# Patient Record
Sex: Female | Born: 1990 | Race: White | Hispanic: No | Marital: Married
Health system: Southern US, Community
[De-identification: ages and names within clinical notes are randomized; demographics above are authoritative.]

---
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
## 2010-06-01 ENCOUNTER — Emergency Department: Payer: Self-pay | Admitting: Emergency Medicine

## 2010-10-10 ENCOUNTER — Emergency Department: Payer: Self-pay | Admitting: Emergency Medicine

## 2010-10-17 ENCOUNTER — Emergency Department: Payer: Self-pay | Admitting: Unknown Physician Specialty

## 2011-03-08 IMAGING — US US OB < 14 WEEKS - US OB TV
1 series · 17 of 28 positions shown · non-contrast
Comparison: none

REASON FOR EXAM: s/p bleeding
COMMENTS:

PROCEDURE:     US  - US OB LESS THAN 14 WEEKS/W TRANS  - June 01, 2010  [DATE]
RESULT:
HISTORY: Bleeding.

[Series 1: us ob < 14 weeks - us ob tv · 17 of 94 slices shown]
[im 1/94]
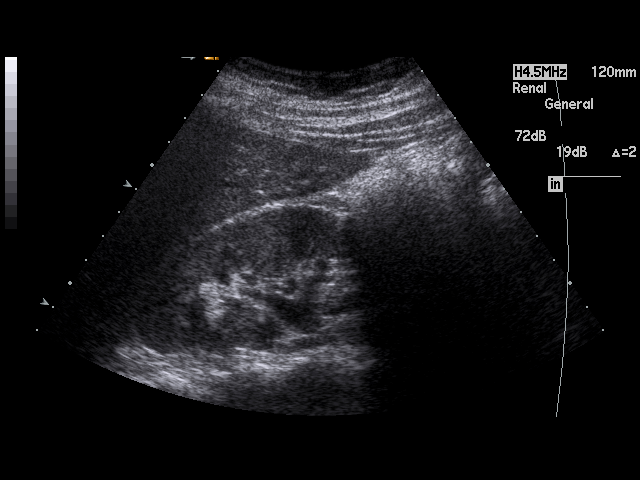
[im 7/94]
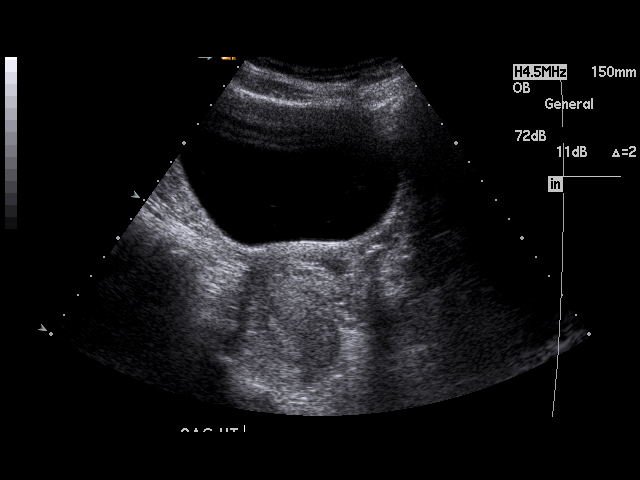
[im 14/94]
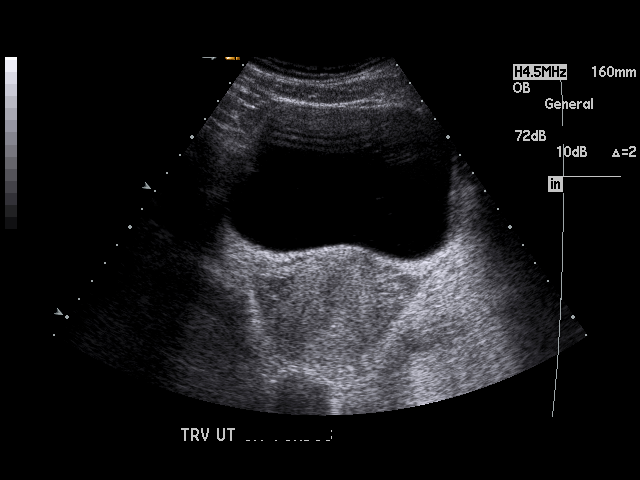
[im 18/94]
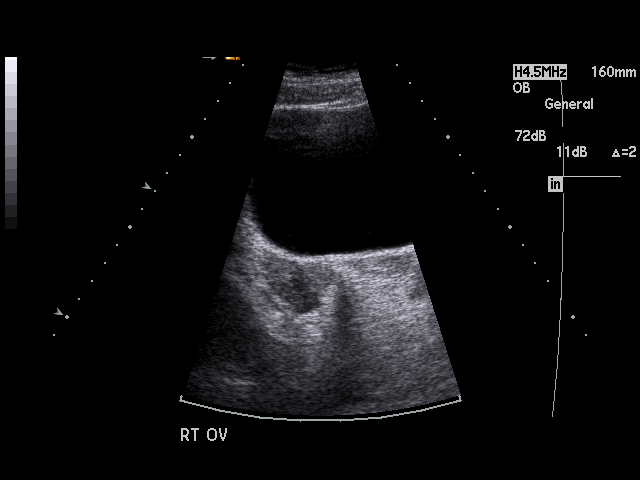
[im 25/94]
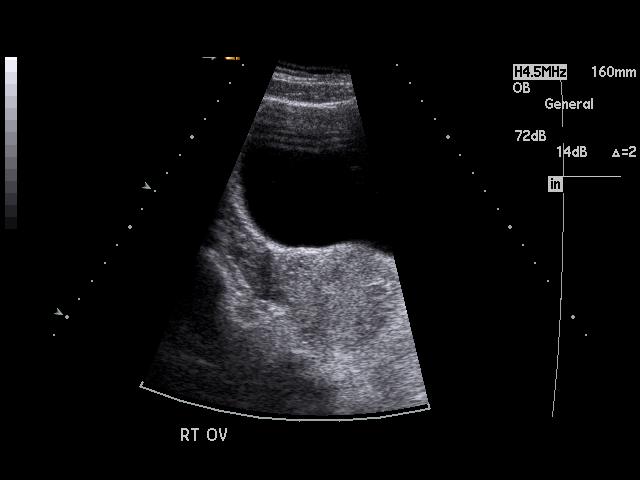
[im 32/94]
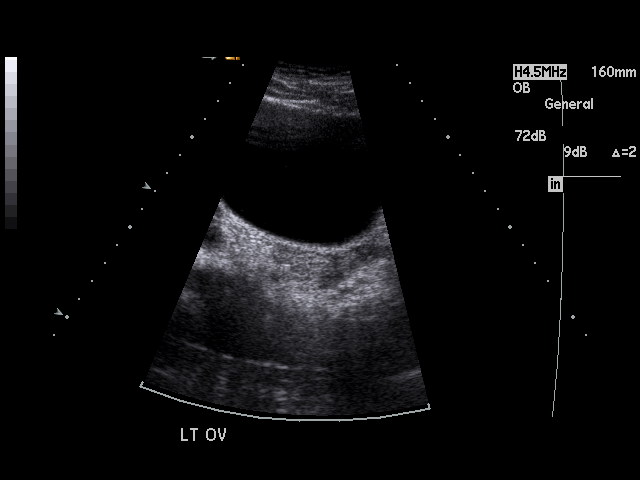
[im 35/94]
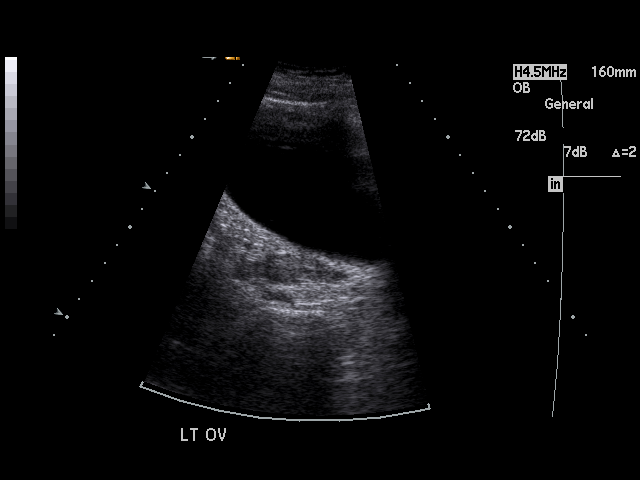
[im 42/94]
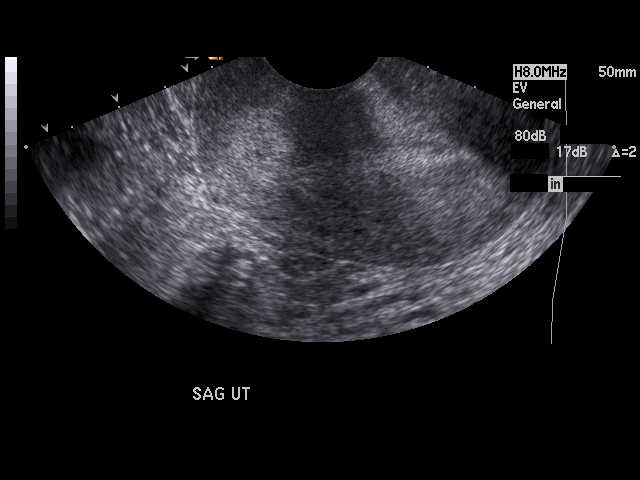
[im 49/94]
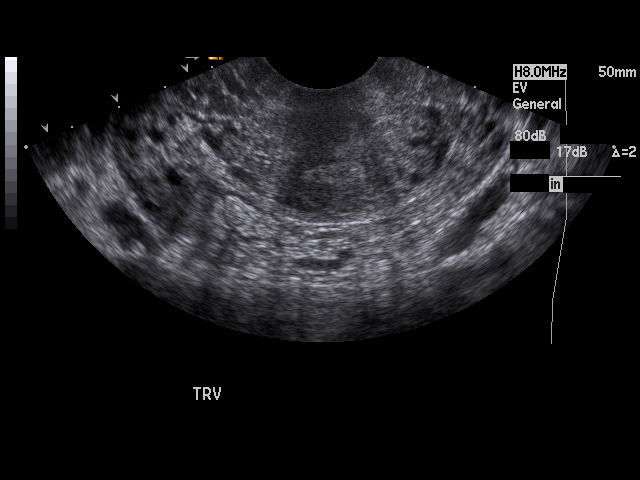
[im 52/94]
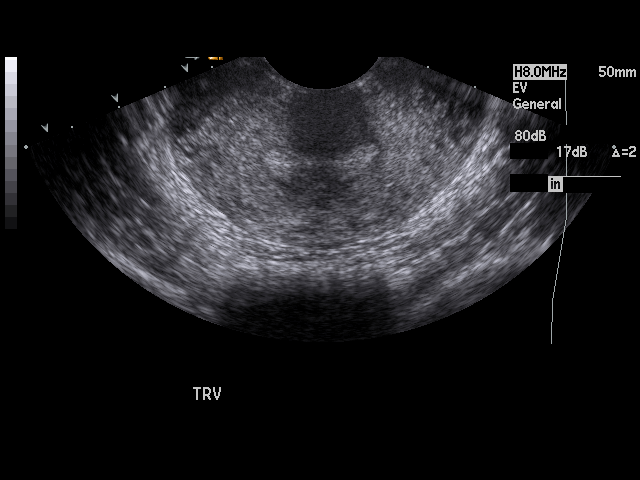
[im 59/94]
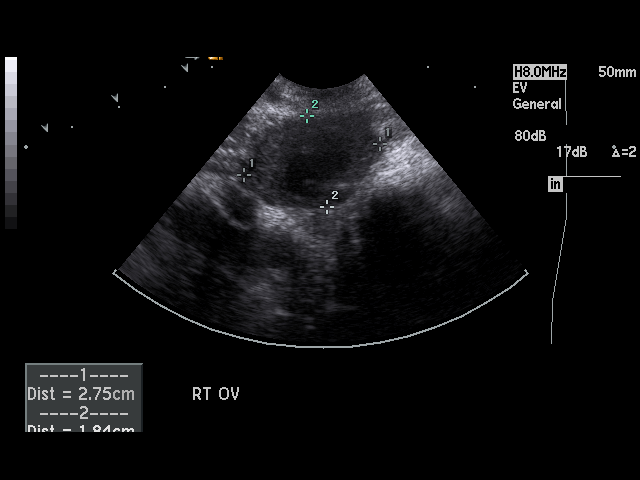
[im 63/94]
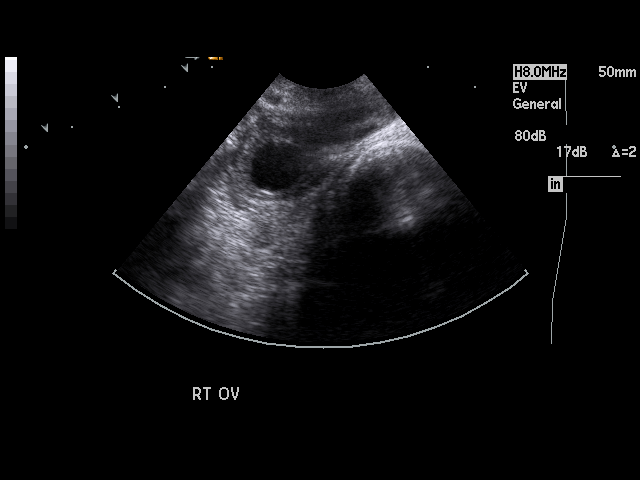
[im 69/94]
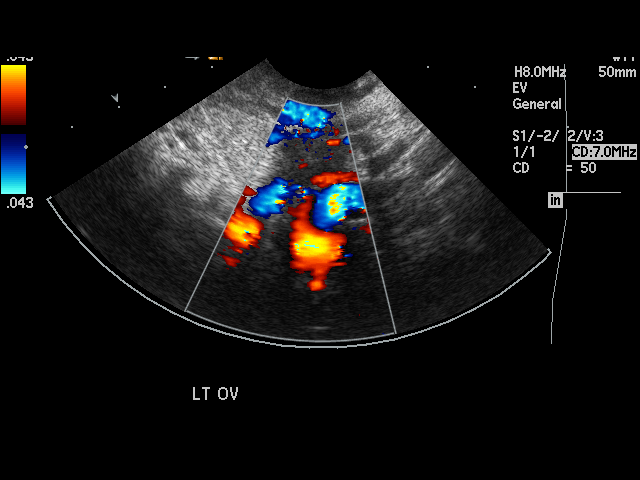
[im 76/94]
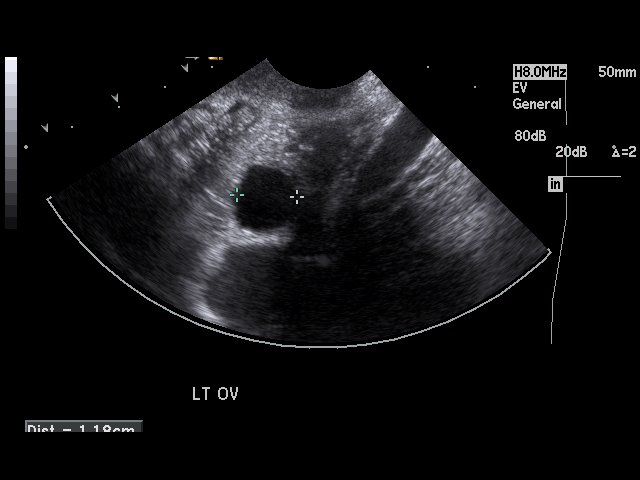
[im 80/94]
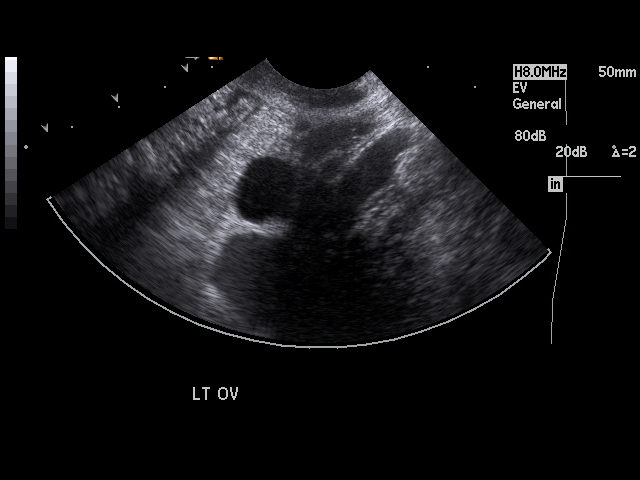
[im 87/94]
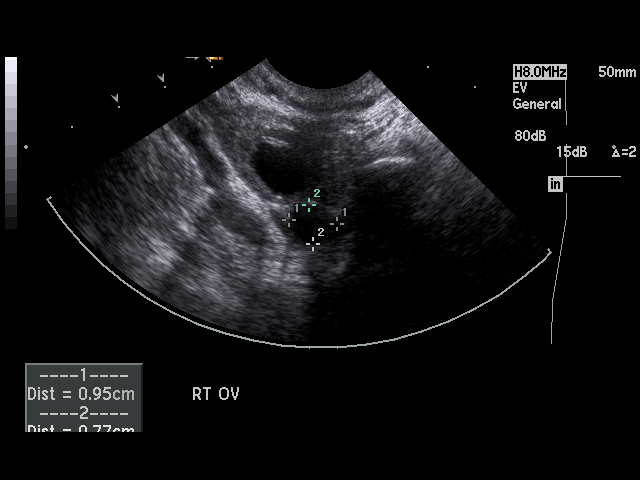
[im 94/94]
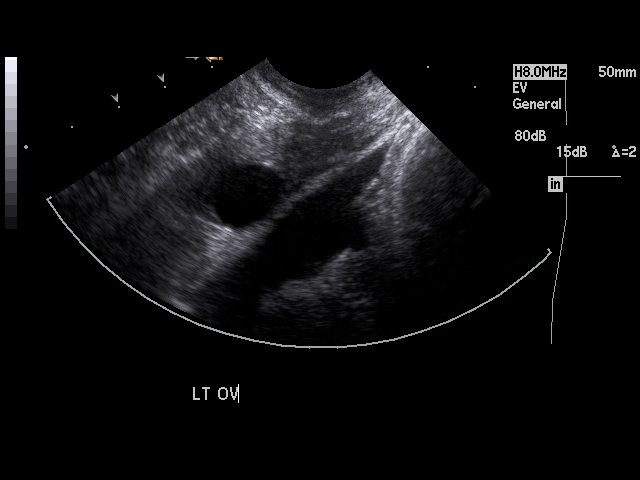

[17 of 28 positions shown; findings below may reference images not displayed]

PROCEDURE AND FINDINGS:  Standard OB ultrasound is obtained. No intrauterine
gestation is noted. The patient has a history of previously visualized
pregnancy from recent ultrasound. These findings suggest aborted pregnancy.
An embryonic pregnancy and ectopic pregnancy could also present in this
fashion. Early gestation could present in this fashion. Follow-up Beta HCG's
and Pelvic Ultrasound suggested. No adnexal masses are noted. A minimal
amount of fluid is noted in the endometrial canal. The uterus is
retroverted. Tiny ovarian cysts are present. No definite ectopic identified.
IMPRESSION: No intrauterine gestation identified. This report was
phoned to the patient's physician at the time of the study. Follow-up Beta
HCG's and Pelvic Ultrasound suggested as described above.

## 2011-11-19 ENCOUNTER — Emergency Department: Payer: Self-pay | Admitting: Internal Medicine

## 2011-11-19 LAB — HCG, QUANTITATIVE, PREGNANCY: Beta Hcg, Quant.: 9667 m[IU]/mL — ABNORMAL HIGH

## 2011-11-19 LAB — CBC
HCT: 33.2 % — ABNORMAL LOW (ref 35.0–47.0)
MCV: 93 fL (ref 80–100)
Platelet: 174 10*3/uL (ref 150–440)
RDW: 11.7 % (ref 11.5–14.5)
WBC: 6.2 10*3/uL (ref 3.6–11.0)

## 2011-11-19 LAB — COMPREHENSIVE METABOLIC PANEL
Albumin: 3.6 g/dL (ref 3.4–5.0)
Alkaline Phosphatase: 47 U/L — ABNORMAL LOW (ref 50–136)
Anion Gap: 10 (ref 7–16)
BUN: 10 mg/dL (ref 7–18)
Bilirubin,Total: 0.4 mg/dL (ref 0.2–1.0)
Co2: 25 mmol/L (ref 21–32)
Creatinine: 0.56 mg/dL — ABNORMAL LOW (ref 0.60–1.30)
Glucose: 83 mg/dL (ref 65–99)
Potassium: 3.6 mmol/L (ref 3.5–5.1)
SGOT(AST): 13 U/L — ABNORMAL LOW (ref 15–37)
Total Protein: 7.2 g/dL (ref 6.4–8.2)

## 2011-11-19 LAB — URINALYSIS, COMPLETE
Bilirubin,UR: NEGATIVE
Glucose,UR: NEGATIVE mg/dL (ref 0–75)
Leukocyte Esterase: NEGATIVE
Ph: 5 (ref 4.5–8.0)
RBC,UR: 7 /HPF (ref 0–5)
Squamous Epithelial: 4
WBC UR: 3 /HPF (ref 0–5)

## 2012-01-11 ENCOUNTER — Emergency Department: Payer: Self-pay | Admitting: Emergency Medicine

## 2012-01-11 LAB — URINALYSIS, COMPLETE
Bilirubin,UR: NEGATIVE
Blood: NEGATIVE
Ketone: NEGATIVE
Ph: 7 (ref 4.5–8.0)
Protein: NEGATIVE
Specific Gravity: 1.019 (ref 1.003–1.030)
Squamous Epithelial: 2

## 2012-01-11 LAB — PREGNANCY, URINE: Pregnancy Test, Urine: POSITIVE m[IU]/mL

## 2012-01-12 LAB — HCG, QUANTITATIVE, PREGNANCY: Beta Hcg, Quant.: 65674 m[IU]/mL — ABNORMAL HIGH

## 2012-03-03 ENCOUNTER — Observation Stay: Payer: Self-pay

## 2012-03-03 LAB — CBC WITH DIFFERENTIAL/PLATELET
Basophil %: 0.3 %
Eosinophil #: 0.1 10*3/uL (ref 0.0–0.7)
Eosinophil %: 0.9 %
HCT: 26.7 % — ABNORMAL LOW (ref 35.0–47.0)
Lymphocyte %: 23.6 %
MCV: 95 fL (ref 80–100)
Neutrophil %: 69 %
RBC: 2.81 10*6/uL — ABNORMAL LOW (ref 3.80–5.20)

## 2012-03-03 LAB — URINALYSIS, COMPLETE
Bacteria: NONE SEEN
Bilirubin,UR: NEGATIVE
Glucose,UR: NEGATIVE mg/dL (ref 0–75)
Nitrite: NEGATIVE
Protein: NEGATIVE
WBC UR: 1 /HPF (ref 0–5)

## 2012-10-18 IMAGING — US US OB US >=[ID] SNGL FETUS
1 series · 17 of 28 positions shown · non-contrast
Comparison: none

REASON FOR EXAM: 15 weeks pregnant with pelvic pain
COMMENTS:

PROCEDURE:     US  - US OB GREATER/OR EQUAL TO 8GOIA  - January 12, 2012  [DATE]
RESULT:
Transabdominal imaging of the abdomen was obtained.
TECHNIQUE: Real-time transabdominal B-mode OB ultrasound evaluation with
image documentation was obtained.

[Series 1: us ob us >=(id) sngl fetus · 17 of 79 slices shown]
[im 1/79]
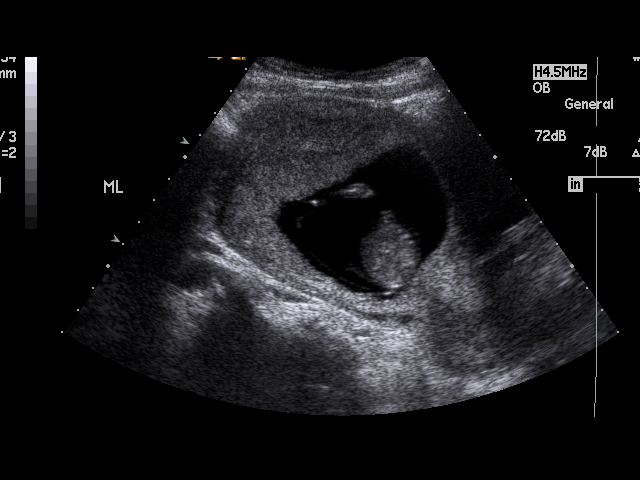
[im 6/79]
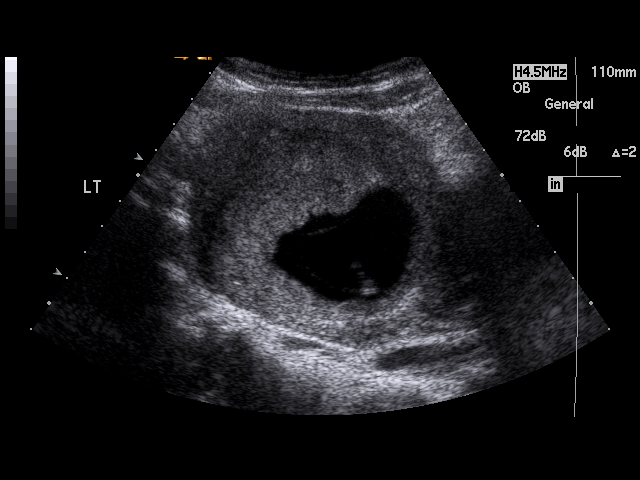
[im 12/79]
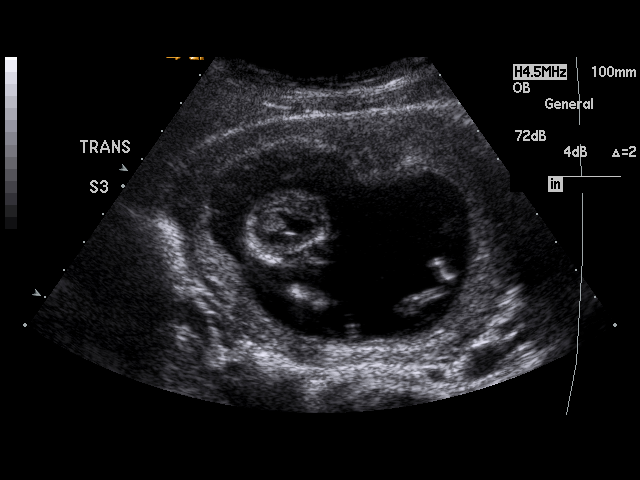
[im 15/79]
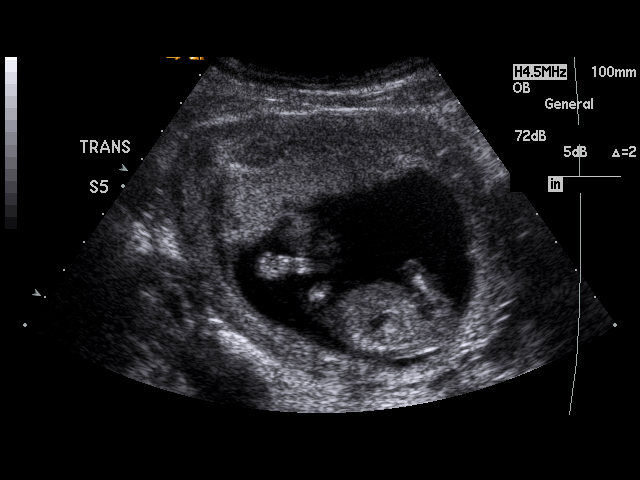
[im 21/79]
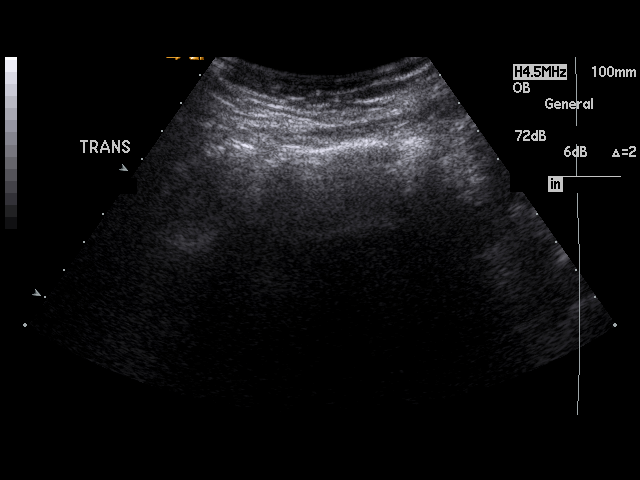
[im 27/79]
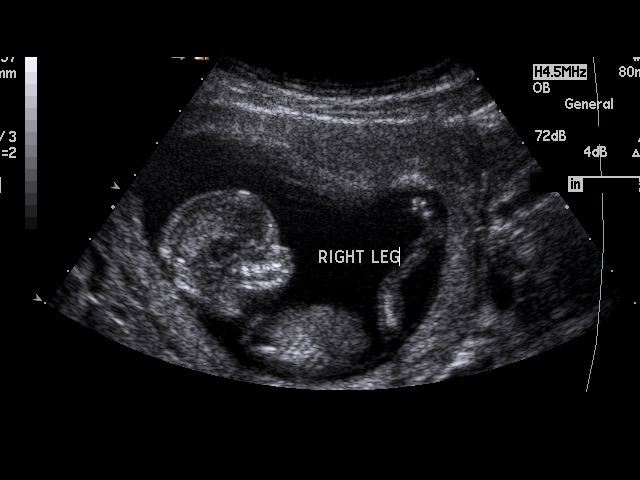
[im 29/79]
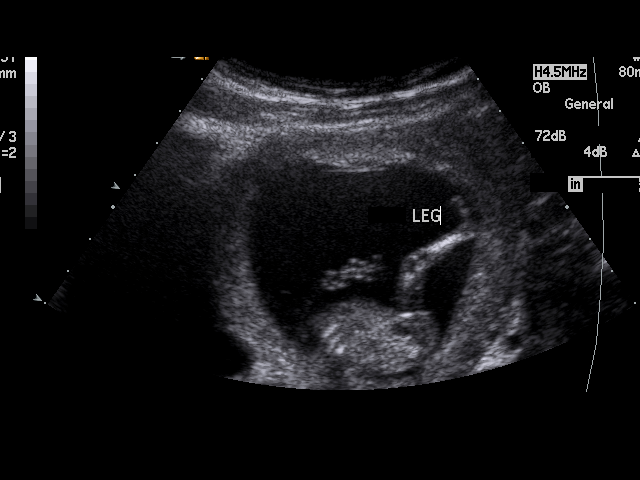
[im 35/79]
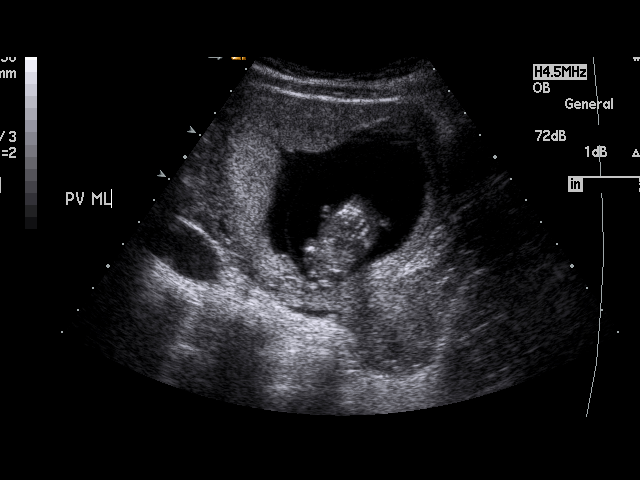
[im 41/79]
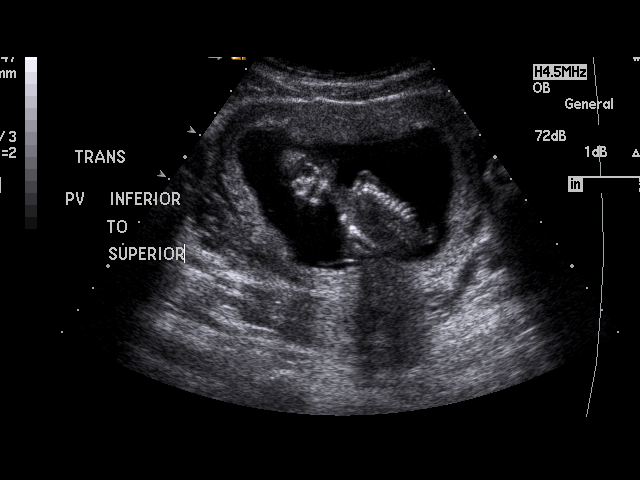
[im 44/79]
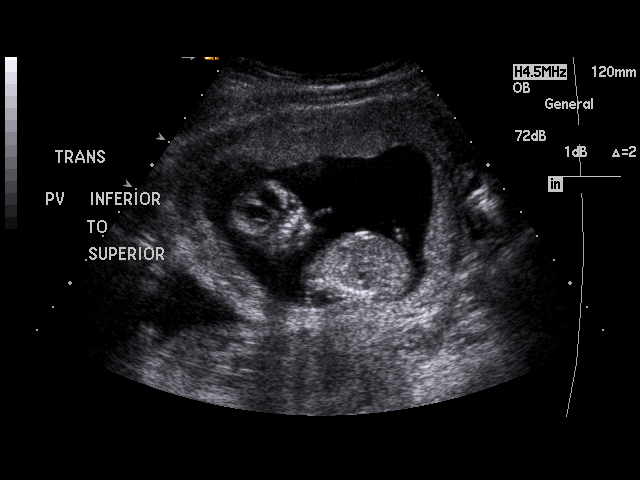
[im 50/79]
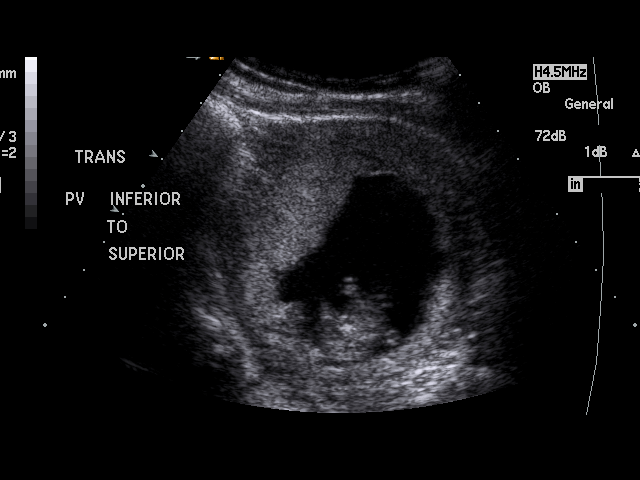
[im 53/79]
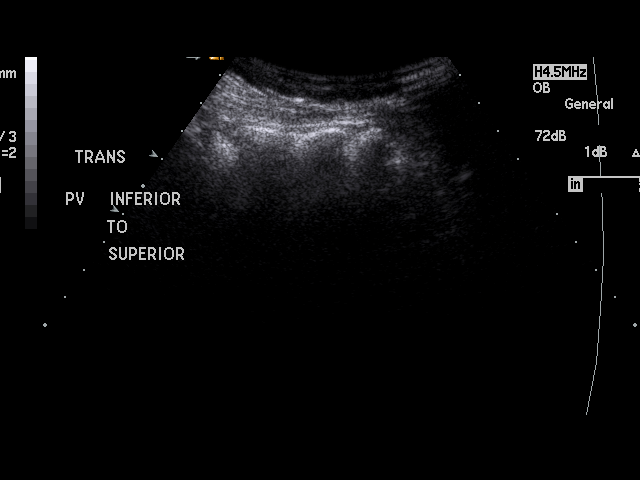
[im 58/79]
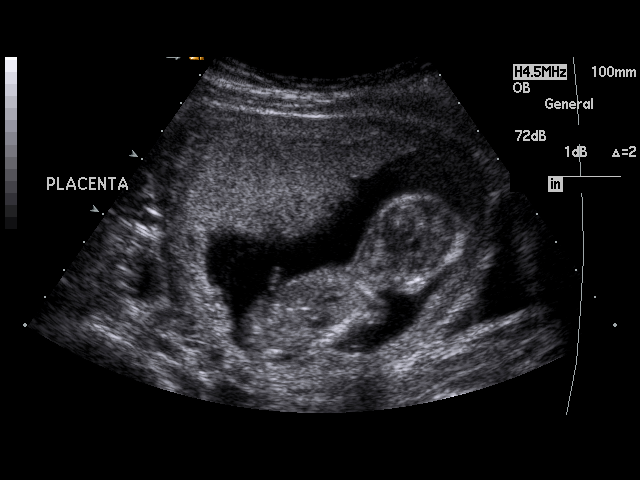
[im 64/79]
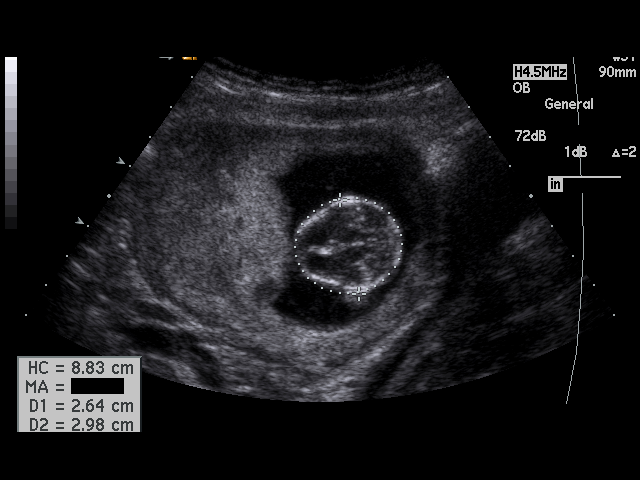
[im 67/79]
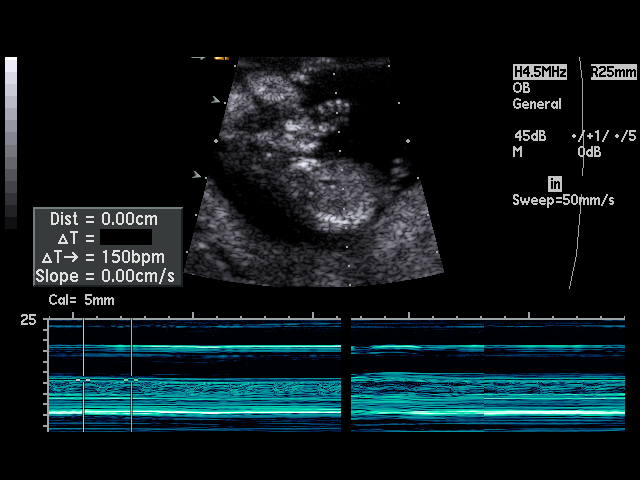
[im 73/79]
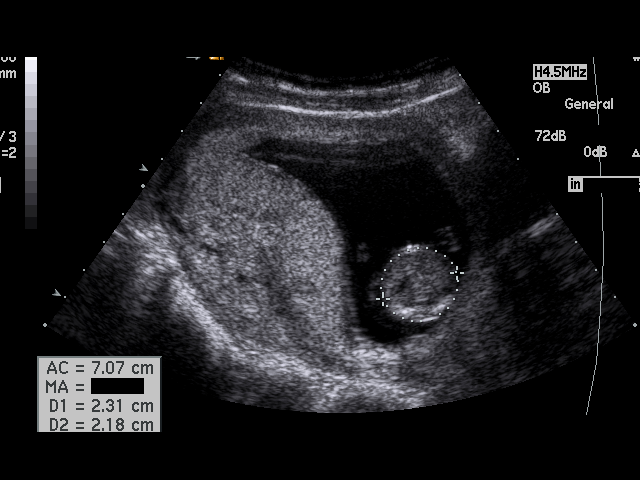
[im 79/79]
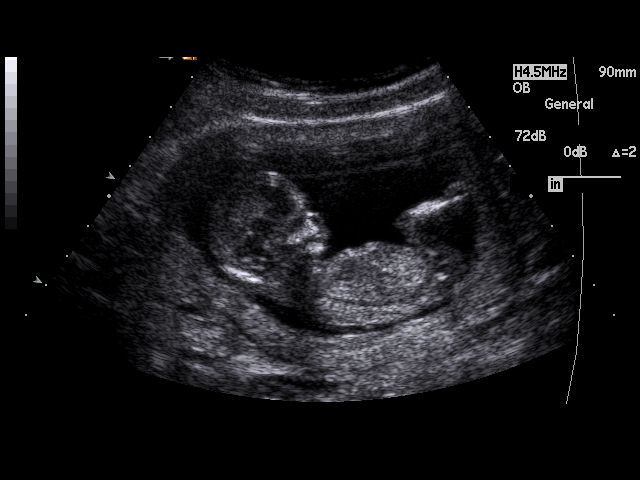

[17 of 28 positions shown; findings below may reference images not displayed]

FINDINGS: A single viable intrauterine pregnancy with an estimated
gestational age of 14 weeks 2 days. Cardiac activity is identified with
estimated rate of 150 beats per minute. The visualized anatomy is
unremarkable. Amniotic fluid volume is grossly unremarkable. The placenta is
anterior. No placental abnormality is identified. There is no evidence of
pelvic free fluid or loculated fluid collections.
IMPRESSION: 1.     Single viable intrauterine pregnancy as described above.
2.     Dr. Paru of the Emergency Department was informed of these via a
preliminary faxed report.

## 2012-12-08 IMAGING — US US OB US >=[ID] SNGL FETUS
1 series · 17 of 28 positions shown · non-contrast
Comparison: none

REASON FOR EXAM: right lower quadrant pain
COMMENTS:

[Series 1: us ob us >=(id) sngl fetus · 17 of 68 slices shown]
[im 1/68]
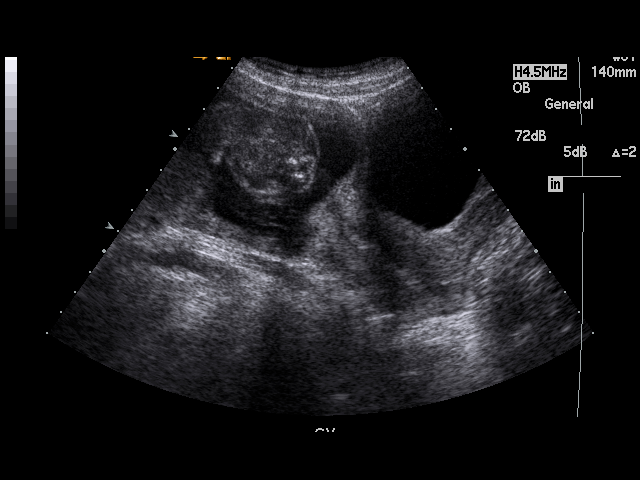
[im 5/68]
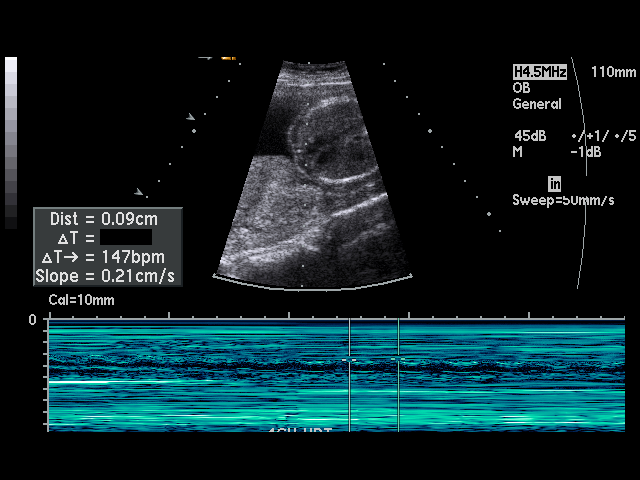
[im 10/68]
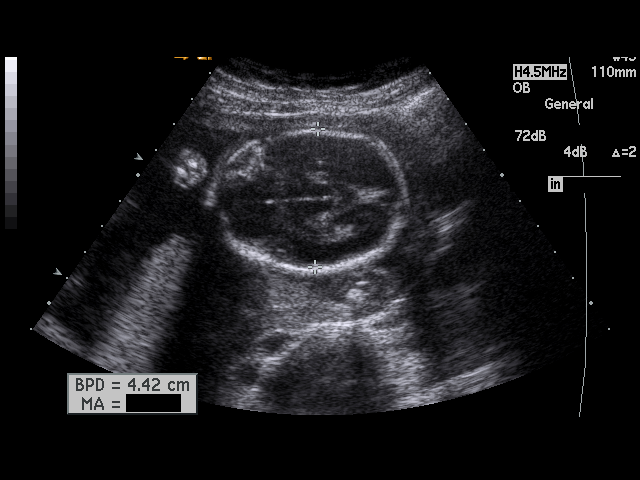
[im 13/68]
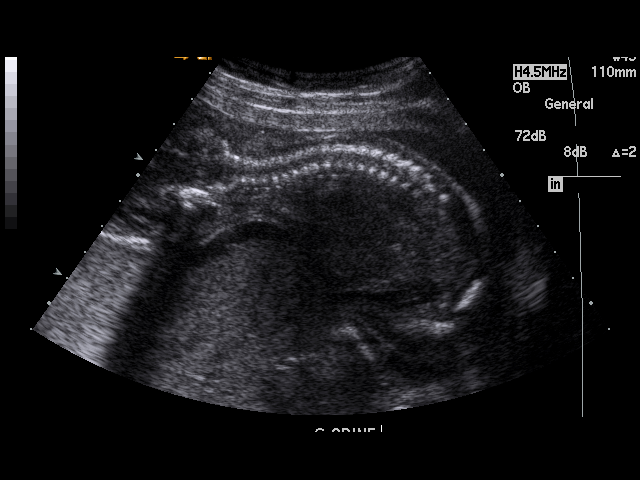
[im 18/68]
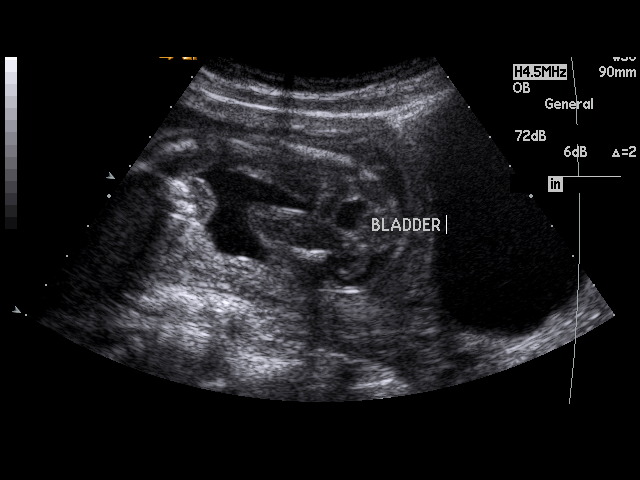
[im 23/68]
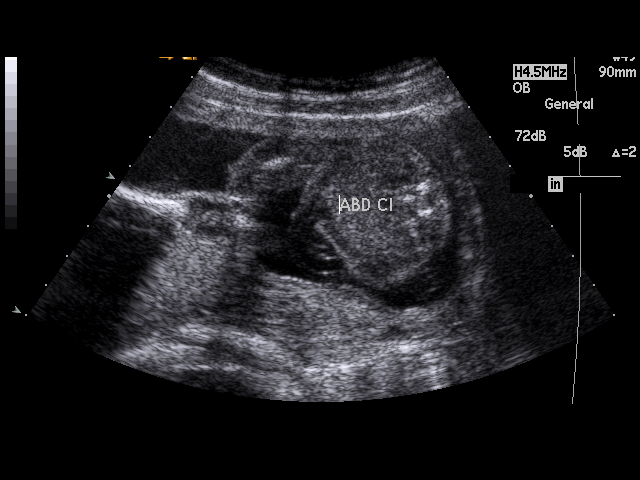
[im 25/68]
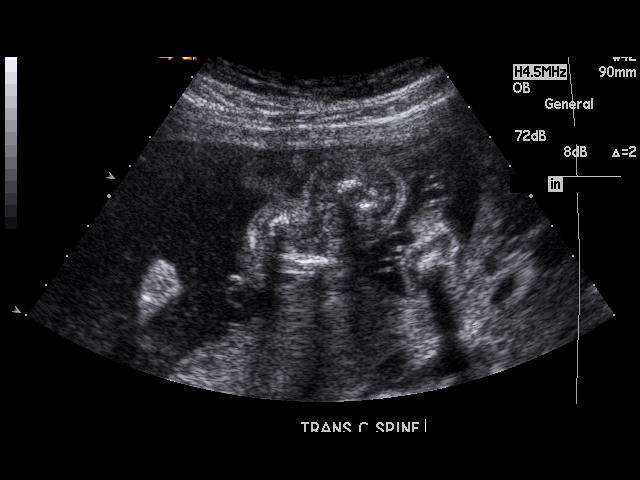
[im 30/68]
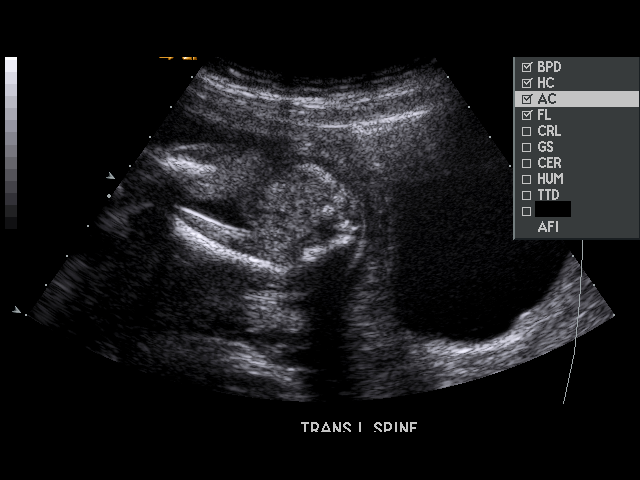
[im 35/68]
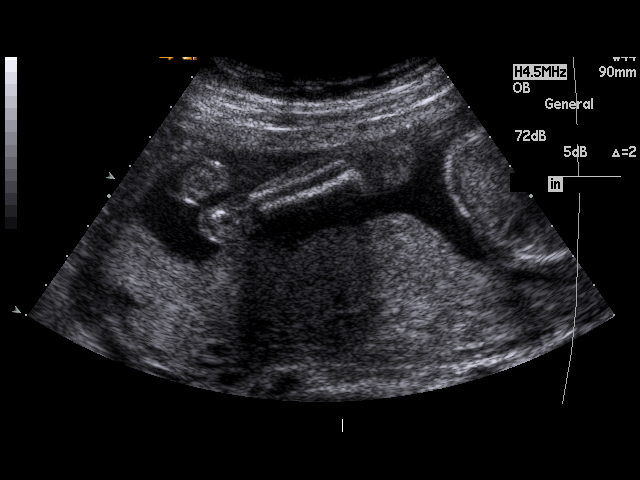
[im 38/68]
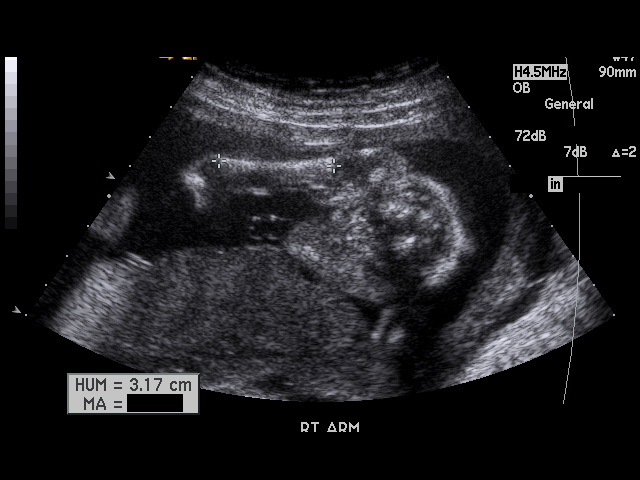
[im 43/68]
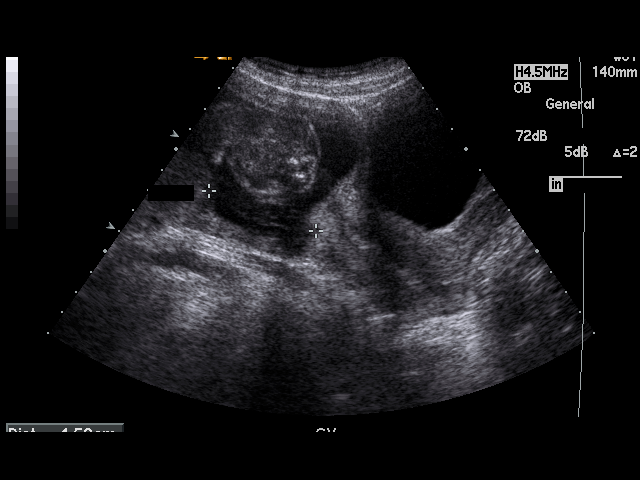
[im 45/68]
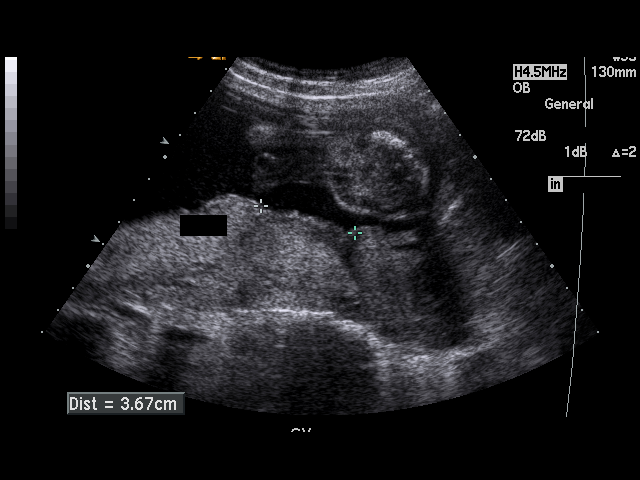
[im 50/68]
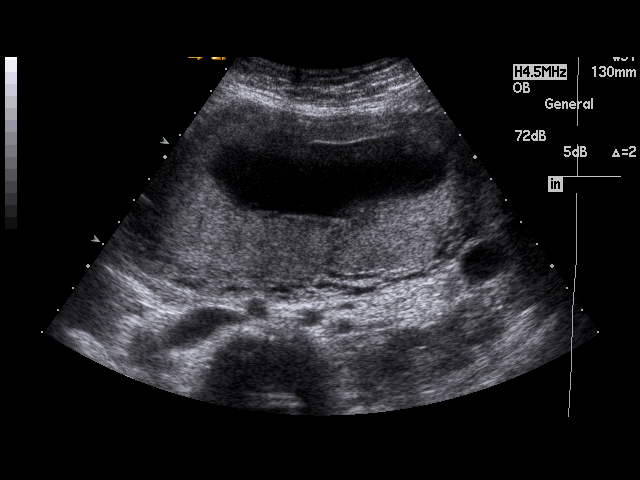
[im 55/68]
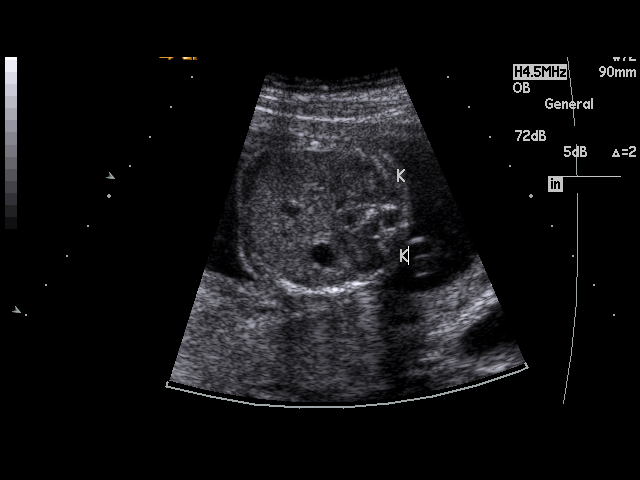
[im 58/68]
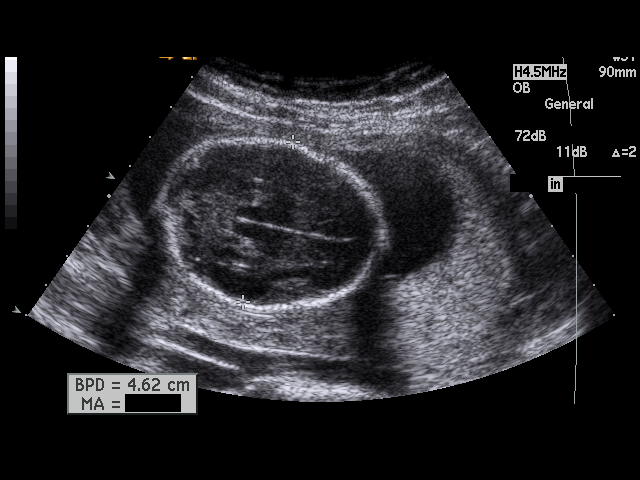
[im 63/68]
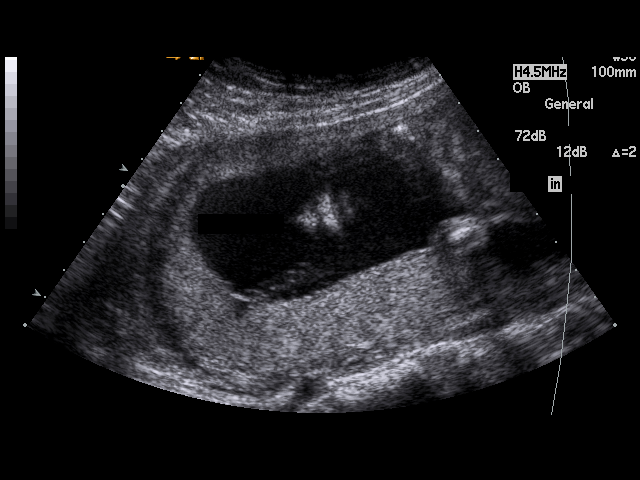
[im 68/68]
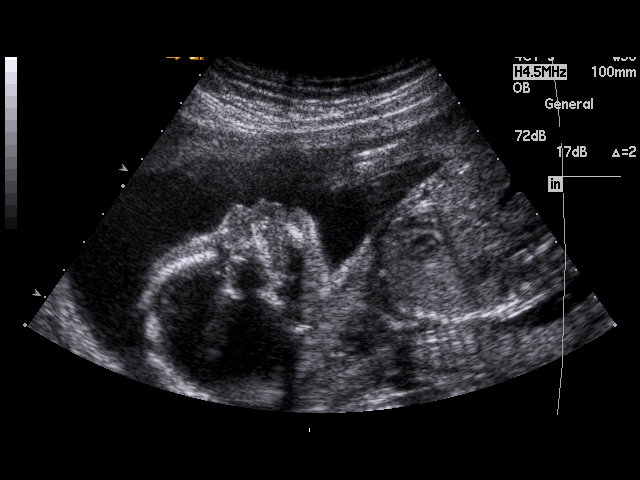

[17 of 28 positions shown; findings below may reference images not displayed]

PROCEDURE:     US  - US OB GREATER/OR EQUAL TO QKOHH  - March 03, 2012  [DATE]

RESULT:     A ultrasound assessment with an OB protocol demonstrates the
length of the cervix and 5.32 cm. The cervix is closed. A single
intrauterine fetus is present with a variable lie throughout the exam.
Amniotic fluid volume is visually normal. Fetal heart rate is 147 beats per
minute. The fetal spine, bladder, kidneys, four-chamber view of the heart,
diaphragm, intracranial ventricles and urinary bladder appear within normal
limits. The stomach is unremarkable. The umbilical cord appears normal as
does the cord insertion. No abnormal fluid collection is seen. The distance
from the internal cervical os to the lower margin of the posterior placenta
is 3.67 cm. Gestational age based on fetal measurements is 20 weeks 0 days
plus or minus between 10 and 14 days with an ultrasound EDD 21 July, 2012 and a current estimated fetal weight of 340 grams + / - 46 grams.
IMPRESSION: 1. Single intrauterine fetus of 20 weeks sonographically. No gross
abnormality. Since the previous exam 7 weeks and 2 days past. Based on the
previous study that would place the fetus at 21 weeks 4 days. This
demonstrates a 11 day disparity between the previous measurements on the
current measurements.

[REDACTED]

## 2015-03-02 NOTE — H&P (Signed)
L&D Evaluation:  History:   HPI 24 year old G3 P1011 with EDC=07/08/2012 per ultrasound according to patient presents at 8621 6/7 weeks with c/o RLQ cramping which began yesterday after lifting her child. The pain is constant but is worsened by movement, turning, bending, etc.Rates pain 8/10. She denies vaginal bleeding, dysuria, nausea, vomiting or diarrhea associated with pain. She admits some urinary frquency. Denies taking any med for pain. [facility] however tehy have no records from this pregnancy. Had an ultrasound that she paid for to find the sex at 17 weeks. Past hx of a term SVD. Denies any abdominal surgery.    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    General no apparent distress, texting upon arrival, lying quietly on side when talking with provider    Mental Status clear    Chest clear    Heart normal sinus rhythm, no murmur/gallop/rubs    Abdomen gravid female, with FH just above U, uterus soft, tender on right border of uterus; normal BS    Back no CVAT    Edema no edema    Mebranes Intact    FHT WNL (145)    Ucx absent    Skin dry    Other CBC remarkable for anemia (Hct=26.7%) and normal WBC.(7.8)   Impression:   Impression IUP at 21 6/7 weeks per patient hx with RLQ pain. Doubt appendicitis with normal WBC and no GI complaints. Doubt UTI with normal UA. Probable round ligament pain. R/O ovarian mass.. Anemia   Plan:   Plan Tylenol #3 for pain. Ultrasound to confirm dates and check for ovarian mass.   Electronic Signatures: Trinna BalloonGutierrez, Kinston Magnan L (CNM)  (Signed 12-May-13  08:24)  Authored: L&D Evaluation   Last Updated: 12-May-13 08:24 by Trinna BalloonGutierrez, Armonie Mettler L (CNM)
